# Patient Record
Sex: Male | Born: 1985 | Race: White | Hispanic: No | Marital: Single | Smoking: Current every day smoker
Health system: Southern US, Community
[De-identification: ages and names within clinical notes are randomized; demographics above are authoritative.]

## PROBLEM LIST (undated history)

## (undated) DIAGNOSIS — J069 Acute upper respiratory infection, unspecified: Secondary | ICD-10-CM

## (undated) HISTORY — DX: Acute upper respiratory infection, unspecified: J06.9

---
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
## 2011-01-07 ENCOUNTER — Ambulatory Visit (HOSPITAL_BASED_OUTPATIENT_CLINIC_OR_DEPARTMENT_OTHER)
Admission: RE | Admit: 2011-01-07 | Discharge: 2011-01-07 | Disposition: A | Discharge status: Home / Self Care | Payer: BLUE CROSS/BLUE SHIELD | Source: Ambulatory Visit | Attending: Urology | Admitting: Urology

## 2011-01-07 ENCOUNTER — Ambulatory Visit (HOSPITAL_COMMUNITY): Payer: BLUE CROSS/BLUE SHIELD

## 2011-01-07 DIAGNOSIS — N201 Calculus of ureter: Secondary | ICD-10-CM | POA: Insufficient documentation

## 2011-01-07 DIAGNOSIS — N209 Urinary calculus, unspecified: Secondary | ICD-10-CM | POA: Insufficient documentation

## 2011-01-07 DIAGNOSIS — Z01812 Encounter for preprocedural laboratory examination: Secondary | ICD-10-CM | POA: Insufficient documentation

## 2011-01-07 DIAGNOSIS — Z01818 Encounter for other preprocedural examination: Secondary | ICD-10-CM | POA: Insufficient documentation

## 2011-01-07 DIAGNOSIS — F17200 Nicotine dependence, unspecified, uncomplicated: Secondary | ICD-10-CM | POA: Insufficient documentation

## 2011-01-07 LAB — POCT HEMOGLOBIN-HEMACUE: Hemoglobin: 15.2 g/dL (ref 13.0–17.0)

## 2011-01-10 NOTE — Op Note (Signed)
NAME:  Christian Curtis, Christian Curtis                 ACCOUNT NO.:  [id number]  MEDICAL RECORD NO.:  [id number]            PATIENT TYPE:  LOCATION:                                 FACILITY:  PHYSICIAN:  Vicci Reder C. Vernie Ammons, M.D.       DATE OF BIRTH:  DATE OF PROCEDURE: DATE OF DISCHARGE:                              OPERATIVE REPORT   PREOPERATIVE DIAGNOSIS:  Right ureteral calculus.  POSTOPERATIVE DIAGNOSIS:  Right ureteral calculus.  PROCEDURES: 1. Cystoscopy with right retrograde pyelogram including     interpretation. 2. Right ureteroscopy. 3. Stone extraction. 4. Double-J stent (5-French, 24-cm Polaris with string).  SURGEON:  Mariza Bourget C. Vernie Ammons, M.D.  ANESTHESIA:  General.  SPECIMENS:  Stone given to the patient.  BLOOD LOSS:  Minimal.  COMPLICATIONS:  None.  INDICATIONS:  The patient is a 25 year old male who began to have difficulty with a right flank pain due to a stone that was found to be 4 mm to 5 mm and located at the ureteropelvic junction 6 months ago.  He is continued to have intermittent pain and was found by CT scan to have the stone still present within the ureter.  We, therefore, discussed treatment options and has elected to proceed with ureteroscopic extraction of the stone.  The risks, complications, alternatives and limitations were discussed as well.  DESCRIPTION OF OPERATION:  After informed consent, the patient was brought to the major OR, placed on the table, administered general anesthesia, then moved to the dorsal lithotomy position.  His genitalia was then sterilely prepped and draped and an official time-out was then performed.  Initially, the 22-French cystoscope with 12-degree lens was passed under direct vision down urethra which was noted be normal.  The prostatic urethra was nonobstructing and noted be free of lesions.  The bladder was then entered and noted to be free of any tumor, stones or inflammatory lesions.  Ureteral orifices were normal in  configuration and position.  However, there was bloody efflux coming from the right ureter.  6-French open-ended ureteral catheter was then passed through the cystoscope and into the right ureteral orifice and full strength contrast was then injected to perform a right retrograde pyelogram in standard fashion.  Under direct fluoroscopy, as I injected the contrast, I noted a normal-appearing distal ureter but at about the bottom of the sacrum, the ureter transitioned into mildly dilated ureter with a filling defect consistent with the stone seen within.  The remainder of the collecting system was noted be entirely normal.  A 0.038-inch floppy tip guidewire was then passed into the area of the renal pelvis under fluoroscopy and I then attempted to pass the inner portion of the ureteral access sheath over the guidewire but met some resistance.  I therefore elected to use the dilating balloon and passed this over the guidewire beyond the area where the transition from dilated ureter to normal-appearing ureter was located.  I then inflated the balloon and did note some wasting of the balloon.  It was left inflated for 3 minutes and then deflated.  The guidewire was left in place and the  flexible ureteroscope was then passed over the guidewire and once the scope was within the ureter, I removed the guidewire and then passed the scope under direct vision.  I was able to identify the stone and I noted there are actually 2 stones present.  I used a nitinol basket to grasp the most distal stone and extracted that without difficulty.  I then reinserted the cystoscope, reinserted the guidewire and then was able to pass the ureteral access sheath over the guidewire. I then passed the flexible ureteroscope through the access sheath and could not identify any further stone, although there were some stone fragments.  I therefore made sure that the stone had not migrated up into the kidney by advancing  the scope into the kidney and systematically inspected each calix including the upper middle and pole calyceal systems.  This was performed twice to be sure there was no stone present.  I also inspected the renal pelvis and noted no stone and therefore backed the scope on down the ureter under direct vision and noted multiple small stone fragments, indicating that the larger fragment appeared to have broken into multiple small pieces.  I, therefore, passed the guidewire through the ureteroscope into the area of the renal pelvis and left that in place, as I backed the ureteroscope down and out of the ureter under direct vision again noting no further stone present.  The ureter was noted to be intact throughout its length.  The cystoscope was back loaded over the guidewire and the stent passed over the guidewire into the area of the renal pelvis with good curl being noted in the renal pelvis as I removed the guidewire.  The bladder was drained and the string that was affixed to the distal aspect of the stent was left in place and will be affixed to the dorsum of the penis. I used 2% lidocaine jelly and inserted this in urethra and applied a penile clamp.  The patient was awakened and taken to recovery room in stable and satisfactory condition.  He tolerated procedure well.  There were no intraoperative complications.  He was given written instructions and a prescription for Pyridium 200 mg #6 and Detrol LA 4 mg for every 24-hour use #16 with followup in my office in 1 week for stent removal.     Imelda Dandridge C. Vernie Ammons, M.D.     MCO/MEDQ  D:  01/07/2011  T:  01/07/2011  Job:  [id number]  Electronically Signed by Ihor Gully M.D. on 01/10/2011 07:40:29 PM

## 2012-02-28 IMAGING — CR DG ABDOMEN 1V
2 series · 2 of 2 positions shown · non-contrast
Comparison: None.

CLINICAL DATA: Pre lithotripsy for right urinary tract calculi

ABDOMEN - 1 VIEW

[t abdomen supine (1 of 2)]
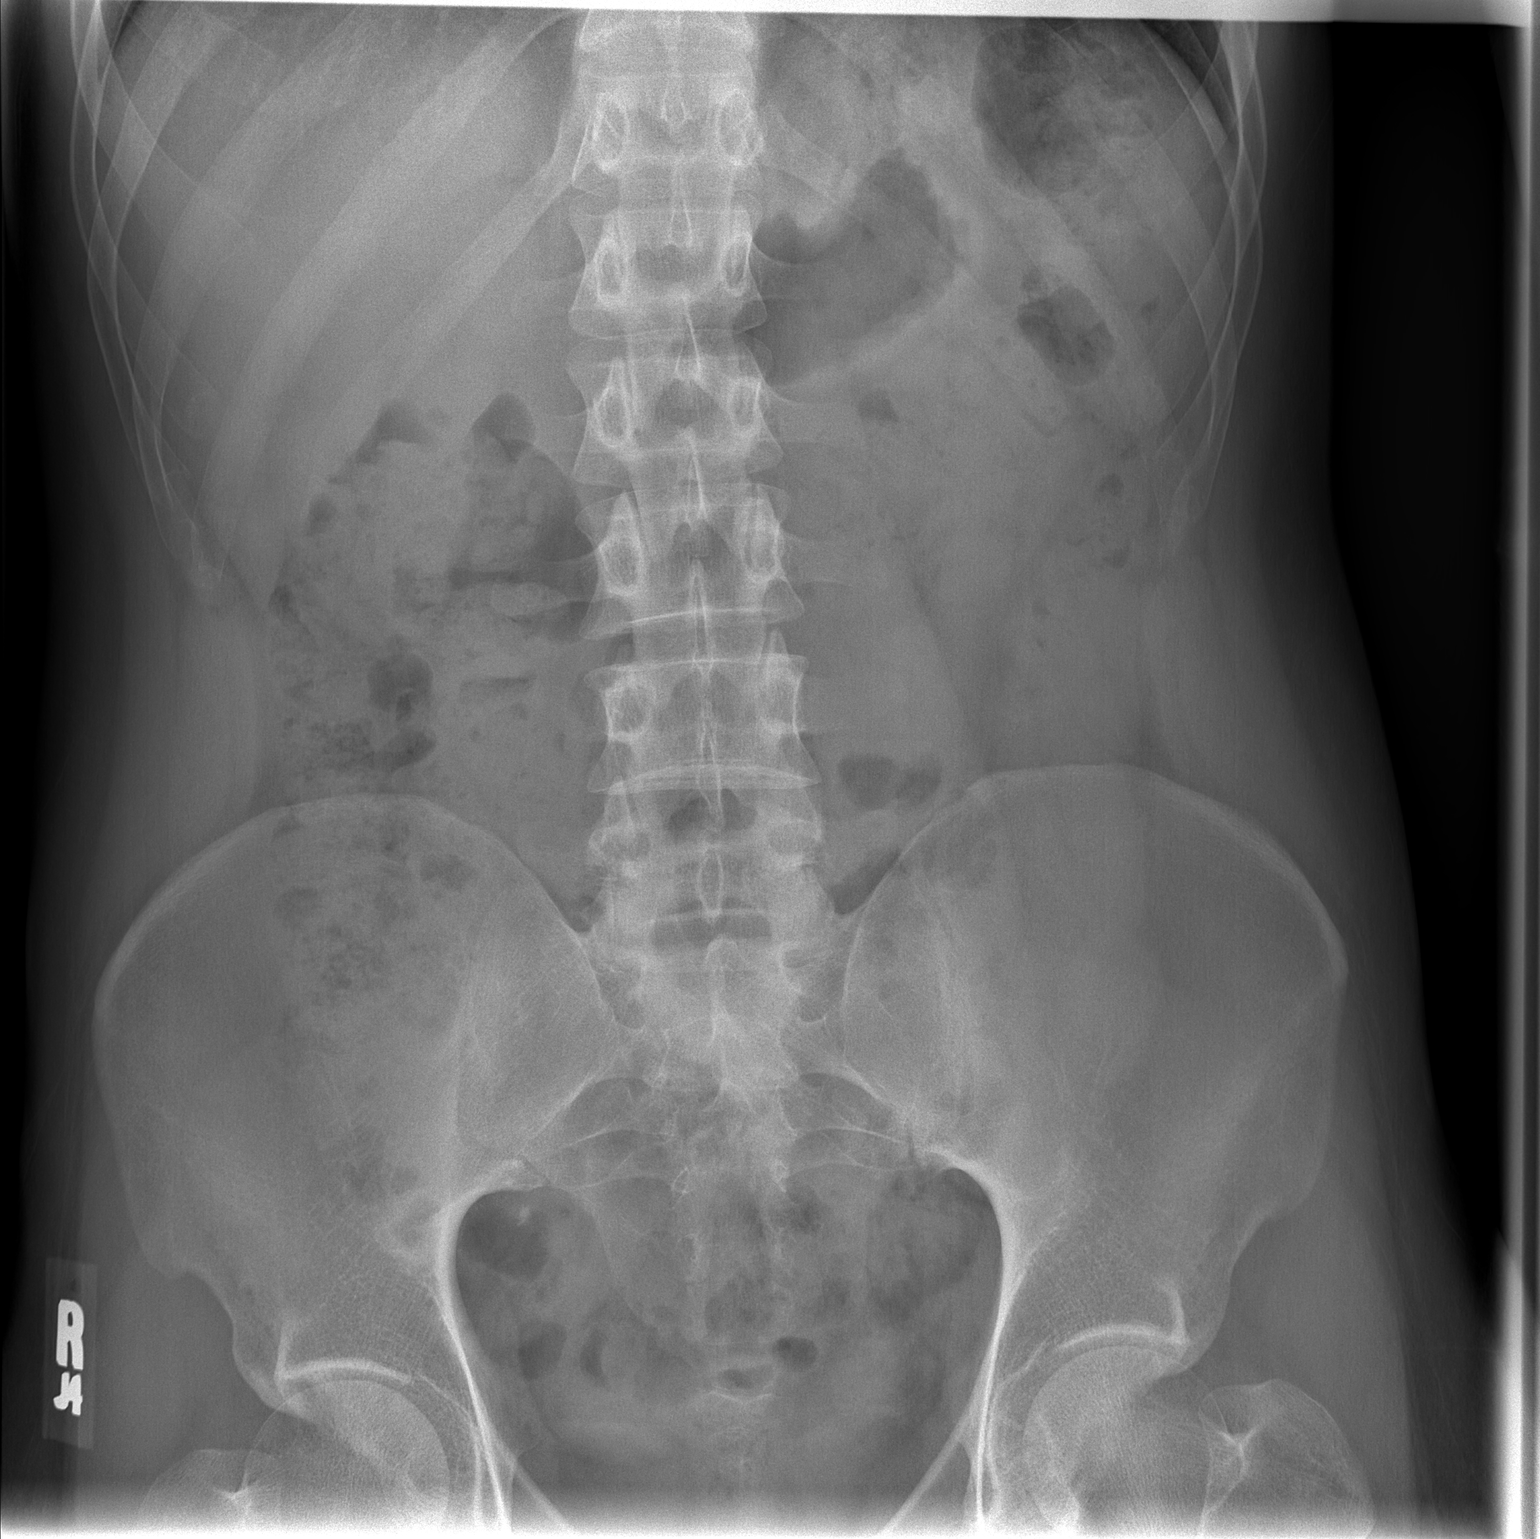

[t abdomen supine (2 of 2)]
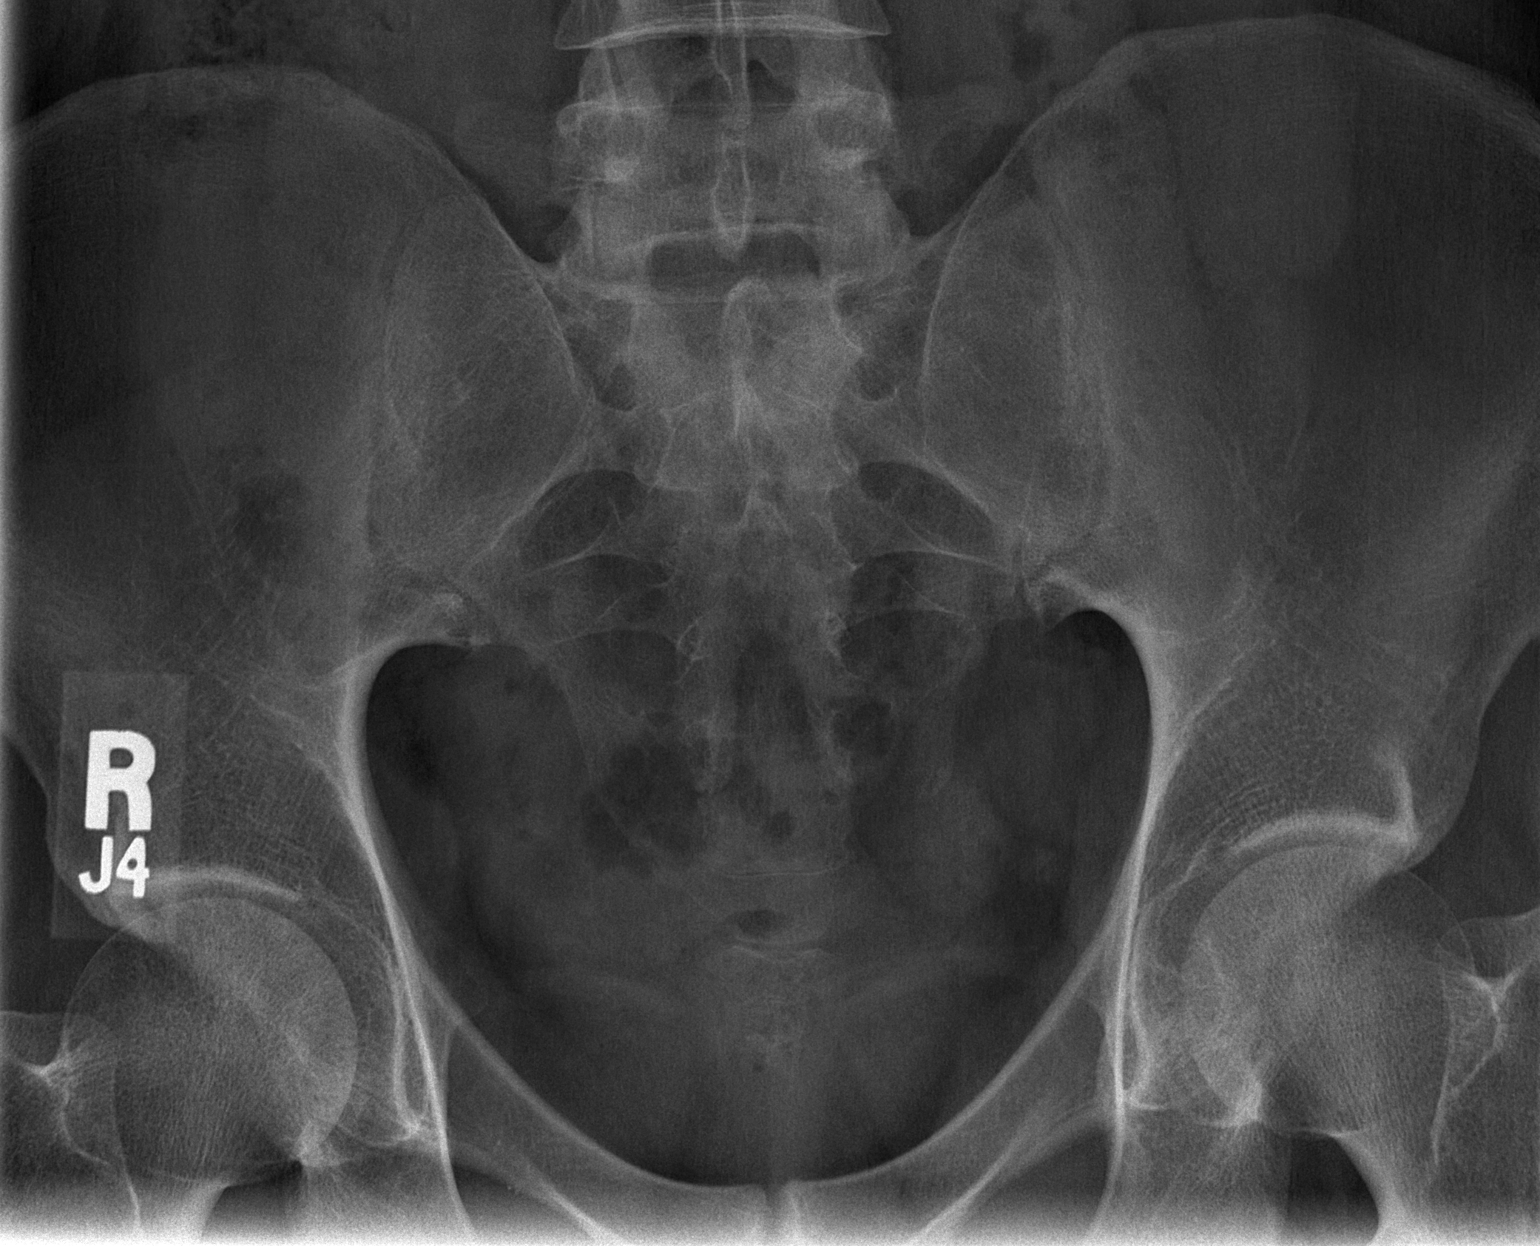

[2 of 2 positions shown; findings below may reference images not displayed]

FINDINGS: There is a 5.5 x 2.9 mm calcification in the right
pelvis, just below the inferior aspect of the SI joint.  This is a
probable distal ureteral calculus.

No obvious renal calculi.
IMPRESSION: Probable 5.5 x 2.9 mm right distal ureteral calculus.

## 2021-11-24 DIAGNOSIS — S93509A Unspecified sprain of unspecified toe(s), initial encounter: Secondary | ICD-10-CM | POA: Diagnosis not present

## 2021-11-24 DIAGNOSIS — S20211A Contusion of right front wall of thorax, initial encounter: Secondary | ICD-10-CM | POA: Diagnosis not present

## 2021-11-24 DIAGNOSIS — S93502A Unspecified sprain of left great toe, initial encounter: Secondary | ICD-10-CM | POA: Diagnosis not present

## 2022-03-25 DIAGNOSIS — J329 Chronic sinusitis, unspecified: Secondary | ICD-10-CM | POA: Diagnosis not present

## 2022-03-25 DIAGNOSIS — M542 Cervicalgia: Secondary | ICD-10-CM | POA: Diagnosis not present

## 2022-03-25 DIAGNOSIS — R69 Illness, unspecified: Secondary | ICD-10-CM | POA: Diagnosis not present

## 2022-03-25 DIAGNOSIS — F17200 Nicotine dependence, unspecified, uncomplicated: Secondary | ICD-10-CM | POA: Diagnosis not present

## 2022-03-25 DIAGNOSIS — B354 Tinea corporis: Secondary | ICD-10-CM | POA: Diagnosis not present

## 2022-06-26 DIAGNOSIS — J342 Deviated nasal septum: Secondary | ICD-10-CM | POA: Diagnosis not present

## 2022-06-26 DIAGNOSIS — J343 Hypertrophy of nasal turbinates: Secondary | ICD-10-CM | POA: Diagnosis not present

## 2022-09-30 DIAGNOSIS — Z0000 Encounter for general adult medical examination without abnormal findings: Secondary | ICD-10-CM | POA: Diagnosis not present

## 2022-09-30 DIAGNOSIS — Z13220 Encounter for screening for lipoid disorders: Secondary | ICD-10-CM | POA: Diagnosis not present

## 2022-10-22 ENCOUNTER — Ambulatory Visit: Payer: 59 | Admitting: Internal Medicine

## 2022-10-22 ENCOUNTER — Encounter: Payer: Self-pay | Admitting: Internal Medicine

## 2022-10-22 ENCOUNTER — Other Ambulatory Visit: Payer: Self-pay

## 2022-10-22 VITALS — BP 104/70 | HR 85 | Temp 98.3°F | Resp 16 | Ht 71.0 in | Wt 256.1 lb

## 2022-10-22 DIAGNOSIS — J3089 Other allergic rhinitis: Secondary | ICD-10-CM | POA: Diagnosis not present

## 2022-10-22 DIAGNOSIS — J302 Other seasonal allergic rhinitis: Secondary | ICD-10-CM

## 2022-10-22 DIAGNOSIS — J310 Chronic rhinitis: Secondary | ICD-10-CM

## 2022-10-22 DIAGNOSIS — F17210 Nicotine dependence, cigarettes, uncomplicated: Secondary | ICD-10-CM | POA: Diagnosis not present

## 2022-10-22 MED ORDER — AZELASTINE HCL 0.1 % NA SOLN: 1.0000 | Freq: Two times a day (BID) | NASAL | 5 refills | Status: DC | PRN

## 2022-10-22 MED ORDER — FLUTICASONE PROPIONATE 50 MCG/ACT NA SUSP: 2.0000 | Freq: Every day | NASAL | 5 refills | Status: DC

## 2022-10-22 NOTE — Progress Notes (Signed)
NEW PATIENT  Date of Service/Encounter:  10/22/22  Consult requested by: No primary care provider on file.   Subjective:   Christian Curtis (DOB: 1986/04/11) is a 37 y.o. male who presents to the clinic on 10/22/2022 with a chief complaint of Allergy Testing .    History obtained from: chart review and patient.  Rhinitis:  Started 2-3 years ago.  Symptoms include:  headaches, nasal congestion, rhinorrhea, post nasal drainage, and itchy eyes  Occurs year-round Potential triggers: cats Treatments tried:  Flonase PRN does help  He does not use oral anti histamines because he gets very bad dry eyes from it.   Previous allergy testing: no History of reflux/heartburn: no History of sinus surgery: no Nonallergic triggers: none  He was seen by ENT 06/26/2022 for chronic sinusitis and noted to have nasal septal deviation, turbinate hypertrophy and dizziness.  Also has a septal spur on L.  Discussed Flonase and OTC anti histamine.   Tobacco Use: He is a current smoker and has smoked since 2006 about 0.25 packs per day. Reports using it now due to stress and boredom. Denies cravings.  He has not used anything for this.  He has thought about quitting but isn't completely sure about it yet.  Has never tried OTC nicotine replacements.    Past Medical History: Past Medical History:  Diagnosis Date   Recurrent upper respiratory infection (URI)    Past Surgical History: History reviewed. No pertinent surgical history.  Family History: Family History  Problem Relation Age of Onset   Allergic rhinitis Mother     Social History:  Lives in a 5 year apartment [place] in bedroom: carpet Pets: cat Tobacco use/exposure: 2006; 0.25 pack per day Job: unemployed  Medication List:  Allergies as of 10/22/2022   Not on File      Medication List        Accurate as of October 22, 2022  3:22 PM. If you have any questions, ask your nurse or doctor.           amphetamine-dextroamphetamine 10 MG tablet Commonly known as: ADDERALL Take 10 mg by mouth daily.   AzaSite 1 % ophthalmic solution Generic drug: azithromycin Place 1 drop into both eyes daily.   azelastine 0.1 % nasal spray Commonly known as: ASTELIN Place 1 spray into both nostrils 2 (two) times daily as needed for rhinitis or allergies. Use in each nostril as directed Started by: Larose Kells, MD   clotrimazole-betamethasone cream Commonly known as: LOTRISONE Apply 1 Application topically as needed.   cycloSPORINE 0.05 % ophthalmic emulsion Commonly known as: RESTASIS Place 1 drop into both eyes daily.   fluticasone 50 MCG/ACT nasal spray Commonly known as: FLONASE Place 2 sprays into both nostrils daily. Started by: Larose Kells, MD         REVIEW OF SYSTEMS: Pertinent positives and negatives discussed in HPI.   Objective:   Physical Exam: BP 104/70   Pulse 85   Temp 98.3 F (36.8 C) (Temporal)   Resp 16   Ht 5\' 11"  (1.803 m)   Wt 256 lb 1.6 oz (116.2 kg)   SpO2 97%   BMI 35.72 kg/m  Body mass index is 35.72 kg/m. GEN: alert, well developed HEENT: clear conjunctiva, TM grey and translucent, nose with + inferior turbinate hypertrophy, pink nasal mucosa, slight clear rhinorrhea, no cobblestoning HEART: regular rate and rhythm, no murmur LUNGS: clear to auscultation bilaterally, no coughing, unlabored respiration ABDOMEN: soft, non distended  SKIN: no rashes or lesions  Reviewed:  He was seen by ENT 06/26/2022 for chronic sinusitis and noted to have nasal septal deviation, turbinate hypertrophy and dizziness.  Also has a septal spur on L.  Discussed Flonase and OTC anti histamine.   09/30/2022: saw PCP Ferne Reus PA due to concern for cat allergies and was referred to Allergy.  He is also a current smoker and they did discuss tobacco cessation.     Skin Testing:  Skin prick testing was placed, which includes aeroallergens/foods, histamine  control, and saline control.  Verbal consent was obtained prior to placing test.  Patient tolerated procedure well.  Allergy testing results were read and interpreted by myself, documented by clinical staff. Adequate positive and negative control.  Results discussed with patient/family.  Airborne Adult Perc - 10/22/22 1415     Allergen Manufacturer Lavella Hammock    Location Back    Number of Test 59             Intradermal - 10/22/22 1450     Time Antigen Placed 1450    Allergen Manufacturer Lavella Hammock    Location Back    Number of Test 15               Assessment:   1. Chronic rhinitis   2. Cigarette nicotine dependence without complication     Plan/Recommendations:  Allergic Rhinitis - Positive skin test 09/2022: grasses, mold, cat, dog. - Avoidance measures discussed. - Use nasal saline rinses before nose sprays such as with Neilmed Sinus Rinse.  Use distilled water.   - Use Flonase 2 sprays each nostril daily. Aim upward and outward. - Use Azelastine 1-2 sprays each nostril twice daily as needed. Aim upward and outward. - Okay to hold off on oral anti histamines as they cause dry eyes. He also has Blepharitis.   - Consider allergy shots as long term control of your symptoms by teaching your immune system to be more tolerant of your allergy triggers - Given paper prescriptions for INCS and INAH as they would not e-prescribe.    Tobacco Use - I want you to strongly consider stopping smoking. This is one of the best things you can do for your long term health. Discussed trying over the counter nicotine replacement such as patch. Patient provided counseling on tobacco cessation today. Discussed the various impacts smoking has on health such as increased infections, lung disease, malignancy, heart disease, stroke etc. Patient is alert and competent.   Willingness to quit: interested but not completely sure yet Methods/skills for cessation: wants to do it on his own and  slowly Medication management for smoking: discussed OTC nicotine patch Resources provided include OTC nicotine replacements Quit date: none Will discuss further at next follow up visit. Spent over 3 minutes for tobacco cessation counseling.       Return in about 2 months (around 12/21/2022).  Harlon Flor, MD [facility] [place] [facility] [place]

## 2022-10-22 NOTE — Patient Instructions (Addendum)
Allergic Rhinitis - Positive skin test 09/2022: grasses, mold, cat, dog. - Avoidance measures discussed. - Use nasal saline rinses before nose sprays such as with Neilmed Sinus Rinse.  Use distilled water.   - Use Flonase 2 sprays each nostril daily. Aim upward and outward. - Use Azelastine 1-2 sprays each nostril twice daily as needed. Aim upward and outward. - Okay to hold off on oral anti histamines as they cause dry eyes. He also has Blepharitis.   - Consider allergy shots as long term control of your symptoms by teaching your immune system to be more tolerant of your allergy triggers  Tobacco Use - I want you to strongly consider stopping smoking. This is one of the best things you can do for your long term health. Discussed trying over the counter nicotine replacement such as patch.

## 2022-12-24 ENCOUNTER — Other Ambulatory Visit: Payer: Self-pay

## 2022-12-24 ENCOUNTER — Encounter: Payer: Self-pay | Admitting: Internal Medicine

## 2022-12-24 ENCOUNTER — Ambulatory Visit: Payer: 59 | Admitting: Internal Medicine

## 2022-12-24 VITALS — BP 120/84 | HR 86 | Temp 98.0°F | Resp 16 | Wt 259.7 lb

## 2022-12-24 DIAGNOSIS — J3089 Other allergic rhinitis: Secondary | ICD-10-CM

## 2022-12-24 DIAGNOSIS — F17210 Nicotine dependence, cigarettes, uncomplicated: Secondary | ICD-10-CM

## 2022-12-24 DIAGNOSIS — J302 Other seasonal allergic rhinitis: Secondary | ICD-10-CM

## 2022-12-24 MED ORDER — AZELASTINE HCL 0.1 % NA SOLN: 1.0000 | Freq: Two times a day (BID) | NASAL | 5 refills | Status: DC | PRN

## 2022-12-24 MED ORDER — FLUTICASONE PROPIONATE 50 MCG/ACT NA SUSP: 2.0000 | Freq: Every day | NASAL | 5 refills | Status: DC

## 2022-12-24 NOTE — Progress Notes (Signed)
FOLLOW UP Date of Service/Encounter:  12/24/22   Subjective:  Christian Curtis (DOB: May 31, 1986) is a 37 y.o. male who returns to the [facility] on 12/24/2022 for follow up for allergic rhinoconjunctivitis and tobacco use.  History obtained from: chart review and patient. Last visit was on October 22, 2022 at that time skin prick test was positive for grasses, mold, cat, dog.  He was started on Flonase and azelastine.  We did not do oral antihistamines as he has trouble with dry eyes/blepharitis.  We had also discussed smoking cessation at the time.  Rhinoconjunctivitis: Reports doing better overall since last visit.  He does still have some congestion and rhinorrhea especially when he forgets to take his medications and this does tend to happen more frequently due to his ADHD.  He also feels like the medications do not last the whole day.  He is using Flonase daily and azelastine daily.  He is interested in allergy shots but would like to discuss with his insurance company first regarding the cost.  Tobacco use: Still smoking anywhere from 3 to 7 cigarettes a day.  He does want to stop smoking at some point but is not sure about completely quitting currently.  Past Medical History: Past Medical History:  Diagnosis Date   Recurrent upper respiratory infection (URI)     Objective:  BP 120/84   Pulse 86   Temp 98 F (36.7 C) (Temporal)   Resp 16   Wt 259 lb 11.2 oz (117.8 kg)   SpO2 96%   BMI 36.22 kg/m  Body mass index is 36.22 kg/m. Physical Exam: GEN: alert, well developed HEENT: clear conjunctiva, TM grey and translucent, nose with moderate inferior turbinate hypertrophy, pink nasal mucosa, clear rhinorrhea, no cobblestoning HEART: regular rate and rhythm, no murmur LUNGS: clear to auscultation bilaterally, no coughing, unlabored respiration SKIN: no rashes or lesions  Assessment:   1. Seasonal and perennial allergic rhinitis   2. Cigarette nicotine  dependence without complication     Plan/Recommendations:  Allergic Rhinitis - Uncontrolled, discussed daily compliance with nose sprays for maximal effect and to also consider AIT.  - Positive skin test 09/2022: grasses, mold, cat, dog. - Avoidance measures discussed. - Use nasal saline rinses before nose sprays such as with Neilmed Sinus Rinse.  Use distilled water.   - Use Flonase 2 sprays each nostril daily. Aim upward and outward. - Use Azelastine 1-2 sprays each nostril twice daily as needed. Aim upward and outward. - Okay to hold off on oral anti histamines as they cause dry eyes. He also has Blepharitis.   - Consider allergy shots as long term control of your symptoms by teaching your immune system to be more tolerant of your allergy triggers.  Please call your insurance company to see the cost associated with allergy shots and if interested in starting, let us know.   Tobacco Use - I want you to strongly consider stopping smoking. This is one of the best things you can do for your long term health and it is also likely contributing to your uncontrolled upper airway symptoms.  Patient provided counseling on tobacco cessation today. Discussed the various impacts smoking has on health such as increased infections, lung disease, malignancy, heart disease, stroke etc. Patient is alert and competent.   Willingness to quit: interested but not completely sure yet Methods/skills for cessation: wants to do it on his own and slowly Medication management for smoking: none, discussed focusing on wanting to  quit first Resources provided include none Quit date: none Will discuss further at next follow up visit. Spent over 3 minutes for tobacco cessation counseling.       Return in about 4 months (around 04/25/2023).  Harlon Flor, MD [facility] [place] [facility] [place]

## 2022-12-24 NOTE — Patient Instructions (Signed)
Allergic Rhinitis - Positive skin test 09/2022: grasses, mold, cat, dog. - Avoidance measures discussed. - Use nasal saline rinses before nose sprays such as with Neilmed Sinus Rinse.  Use distilled water.   - Use Flonase 2 sprays each nostril daily. Aim upward and outward. - Use Azelastine 1-2 sprays each nostril twice daily as needed. Aim upward and outward. - Okay to hold off on oral anti histamines as they cause dry eyes. He also has Blepharitis.   - Consider allergy shots as long term control of your symptoms by teaching your immune system to be more tolerant of your allergy triggers.  Please call your insurance company to see the cost associated with allergy shots and if interested in starting, let us know.   Tobacco Use - I want you to strongly consider stopping smoking. This is one of the best things you can do for your long term health. Discussed trying over the counter nicotine replacement such as patch.

## 2023-03-31 DIAGNOSIS — F909 Attention-deficit hyperactivity disorder, unspecified type: Secondary | ICD-10-CM | POA: Diagnosis not present

## 2023-04-25 ENCOUNTER — Encounter: Payer: Self-pay | Admitting: Internal Medicine

## 2023-04-25 ENCOUNTER — Ambulatory Visit (INDEPENDENT_AMBULATORY_CARE_PROVIDER_SITE_OTHER): Payer: 59 | Admitting: Internal Medicine

## 2023-04-25 VITALS — BP 106/72 | HR 66 | Temp 98.7°F

## 2023-04-25 DIAGNOSIS — J3089 Other allergic rhinitis: Secondary | ICD-10-CM

## 2023-04-25 DIAGNOSIS — J302 Other seasonal allergic rhinitis: Secondary | ICD-10-CM | POA: Diagnosis not present

## 2023-04-25 MED ORDER — AZELASTINE HCL 0.1 % NA SOLN: 1.0000 | Freq: Two times a day (BID) | NASAL | 5 refills | Status: AC | PRN

## 2023-04-25 MED ORDER — FLUTICASONE PROPIONATE 50 MCG/ACT NA SUSP: 2.0000 | Freq: Every day | NASAL | 5 refills | Status: AC

## 2023-04-25 NOTE — Progress Notes (Signed)
   FOLLOW UP Date of Service/Encounter:  04/25/23   Subjective:  Christian Curtis (DOB: 08/22/1986) is a 37 y.o. male who [facility] for follow up for allergic rhinitis and tobacco use.   History obtained from: chart review and patient. Last visit was with me on 12/24/2022 and discussed AIT for uncontrolled symptoms.  Also discussed smoking cessation.   Doing about the same since last visit.  Still having a lot of stuffy nose and at at the end of work shift, has runny nose. He did call insurance and confirm benefits for AIT.  He is working at [employer] and is starting a new shift so wants to figure out his hours before he starts shots.  Taking Flonase and Azelastine daily and it helps but symptoms reoccur about 3-4 hours after.  Unable to do oral anti histamines due to dry eyes.   Past Medical History: Past Medical History:  Diagnosis Date   Recurrent upper respiratory infection (URI)     Objective:  BP 106/72   Pulse 66   Temp 98.7 F (37.1 C) (Temporal)   SpO2 96%  There is no height or weight on file to calculate BMI. Physical Exam: GEN: alert, well developed HEENT: clear conjunctiva, TM grey and translucent, nose with mild inferior turbinate hypertrophy, boggy nasal mucosa, clear rhinorrhea, no cobblestoning HEART: regular rate and rhythm, no murmur LUNGS: clear to auscultation bilaterally, no coughing, unlabored respiration SKIN: no rashes or lesions  Assessment:   1. Seasonal and perennial allergic rhinitis     Plan/Recommendations:  Allergic Rhinitis - Uncontrolled, discussed AIT.   - Positive skin test 09/2022: grasses, mold, cat, dog. - Avoidance measures discussed. - Use nasal saline rinses before nose sprays such as with Neilmed Sinus Rinse.  Use distilled water.   - Use Flonase 2 sprays each nostril daily. Aim upward and outward. - Use Azelastine 1-2 sprays each nostril twice daily as needed. Aim upward and outward. - Okay to  hold off on daily oral anti histamines as they cause dry eyes. He also has Blepharitis.  Take Zyrtec 10mg  on shot days though.  - Consider allergy shots as long term control of your symptoms by teaching your immune system to be more tolerant of your allergy triggers.  Please call us back if interested in starting to let us know and we will mix the vials and send in the Epipen.      Return in about 4 months (around 08/25/2023).  Alesia Morin, MD [facility]

## 2023-04-25 NOTE — Patient Instructions (Addendum)
Allergic Rhinitis - Positive skin test 09/2022: grasses, mold, cat, dog. - Avoidance measures discussed. - Use nasal saline rinses before nose sprays such as with Neilmed Sinus Rinse.  Use distilled water.   - Use Flonase 2 sprays each nostril daily. Aim upward and outward. - Use Azelastine 1-2 sprays each nostril twice daily as needed. Aim upward and outward. - Okay to hold off on oral anti histamines as they cause dry eyes. He also has Blepharitis.  Take Zyrtec 10mg  on shot days.  - Consider allergy shots as long term control of your symptoms by teaching your immune system to be more tolerant of your allergy triggers.  Please call us back if interested in starting to let us know and we will mix the vials and send in the Epipen.

## 2023-05-16 ENCOUNTER — Other Ambulatory Visit: Payer: Self-pay | Admitting: *Deleted

## 2023-05-16 MED ORDER — EPINEPHRINE 0.3 MG/0.3ML IJ SOAJ: 0.3000 mg | INTRAMUSCULAR | 1 refills | Status: AC | PRN

## 2023-05-19 ENCOUNTER — Other Ambulatory Visit: Payer: Self-pay | Admitting: Internal Medicine

## 2023-05-19 DIAGNOSIS — J3089 Other allergic rhinitis: Secondary | ICD-10-CM

## 2023-05-19 DIAGNOSIS — J3081 Allergic rhinitis due to animal (cat) (dog) hair and dander: Secondary | ICD-10-CM

## 2023-05-19 DIAGNOSIS — J301 Allergic rhinitis due to pollen: Secondary | ICD-10-CM

## 2023-05-19 DIAGNOSIS — J302 Other seasonal allergic rhinitis: Secondary | ICD-10-CM

## 2023-05-19 NOTE — Progress Notes (Signed)
 AIT orders placed. Epipen sent.

## 2023-05-20 NOTE — Progress Notes (Signed)
Aeroallergen Immunotherapy   Ordering Provider: Alesia Morin   Patient Details  Name: Christian Curtis  MRN: [id number]  Date of Birth: 09/12/1986   Order 1 of 2   Vial Label: grasses, dogs, cats   0.3 ml (Volume)  BAU Concentration -- 7 Grass Mix* 100,000 ([address] [place], [place], [place], Perennial [place], [place], Sweet [place], [place])  0.5 ml (Volume)  1:10 Concentration -- Cat Hair  0.5 ml (Volume)  1:10 Concentration -- Dog Epithelia    1.3  ml Extract Subtotal  3.7  ml Diluent  5.0  ml Maintenance Total   Schedule:  B  Silver Vial (1:1,000,000): Schedule B (6 doses)  Blue Vial (1:100,000): Schedule B (6 doses)  Yellow Vial (1:10,000): Schedule B (6 doses)  Green Vial (1:1,000): Schedule B (6 doses)  Red Vial (1:100): Schedule A (10 doses)   Special Instructions: Bring Epipen

## 2023-05-20 NOTE — Progress Notes (Signed)
Aeroallergen Immunotherapy   Ordering Provider: Alesia Morin   Patient Details  Name: Christian Curtis  MRN: [id number]  Date of Birth: 1986-01-13   Order 2 of 2   Vial Label: mold   0.2 ml (Volume)  1:20 Concentration -- Bipolaris sorokiniana  0.2 ml (Volume)  1:20 Concentration -- Drechslera spicifera  0.2 ml (Volume)  1:10 Concentration -- Mucor plumbeus  0.2 ml (Volume)  1:10 Concentration -- Fusarium moniliforme  0.2 ml (Volume)  1:40 Concentration -- Aureobasidium pullulans  0.2 ml (Volume)  1:10 Concentration -- Rhizopus oryzae    1.2  ml Extract Subtotal  3.8  ml Diluent  5.0  ml Maintenance Total   Schedule:  B  Silver Vial (1:1,000,000): Schedule B (6 doses)  Blue Vial (1:100,000): Schedule B (6 doses)  Yellow Vial (1:10,000): Schedule B (6 doses)  Green Vial (1:1,000): Schedule B (6 doses)  Red Vial (1:100): Schedule A (10 doses)   Special Instructions: Bring Epipen

## 2023-05-20 NOTE — Progress Notes (Signed)
VIALS EXP 05-19-24

## 2023-05-21 DIAGNOSIS — J3081 Allergic rhinitis due to animal (cat) (dog) hair and dander: Secondary | ICD-10-CM | POA: Diagnosis not present

## 2023-05-22 DIAGNOSIS — J302 Other seasonal allergic rhinitis: Secondary | ICD-10-CM | POA: Diagnosis not present

## 2023-06-10 ENCOUNTER — Ambulatory Visit (INDEPENDENT_AMBULATORY_CARE_PROVIDER_SITE_OTHER): Payer: 59 | Admitting: *Deleted

## 2023-06-10 DIAGNOSIS — J309 Allergic rhinitis, unspecified: Secondary | ICD-10-CM | POA: Diagnosis not present

## 2023-06-10 NOTE — Progress Notes (Signed)
Immunotherapy   Patient Details  Name: Christian Curtis MRN: [id number] Date of Birth: 17-Feb-1986  06/10/2023  Christian Curtis started injections for  MOLD, GRASS, DOG, CAT Following schedule: B  Frequency:1 time per week Epi-Pen:Epi-Pen Available  Consent signed and patient instructions given. Patient started allergy injections today and received 0.6mL of MOLD in the LUA and 0.30mL of GRASS-DOG-CAT in the RUA. Patient waited 30 minutes in office and did not experience any issues.    Christian Curtis 06/10/2023, 4:59 PM

## 2023-06-17 ENCOUNTER — Ambulatory Visit (INDEPENDENT_AMBULATORY_CARE_PROVIDER_SITE_OTHER): Payer: 59

## 2023-06-17 DIAGNOSIS — J309 Allergic rhinitis, unspecified: Secondary | ICD-10-CM | POA: Diagnosis not present

## 2023-06-24 ENCOUNTER — Ambulatory Visit (INDEPENDENT_AMBULATORY_CARE_PROVIDER_SITE_OTHER): Payer: Self-pay | Admitting: *Deleted

## 2023-06-24 DIAGNOSIS — J309 Allergic rhinitis, unspecified: Secondary | ICD-10-CM | POA: Diagnosis not present

## 2023-07-01 ENCOUNTER — Ambulatory Visit (INDEPENDENT_AMBULATORY_CARE_PROVIDER_SITE_OTHER): Payer: 59

## 2023-07-01 DIAGNOSIS — J309 Allergic rhinitis, unspecified: Secondary | ICD-10-CM

## 2023-07-08 ENCOUNTER — Ambulatory Visit (INDEPENDENT_AMBULATORY_CARE_PROVIDER_SITE_OTHER): Payer: Self-pay | Admitting: *Deleted

## 2023-07-08 DIAGNOSIS — J309 Allergic rhinitis, unspecified: Secondary | ICD-10-CM | POA: Diagnosis not present

## 2023-07-15 ENCOUNTER — Ambulatory Visit (INDEPENDENT_AMBULATORY_CARE_PROVIDER_SITE_OTHER): Payer: 59 | Admitting: *Deleted

## 2023-07-15 DIAGNOSIS — J309 Allergic rhinitis, unspecified: Secondary | ICD-10-CM | POA: Diagnosis not present

## 2023-07-22 ENCOUNTER — Ambulatory Visit (INDEPENDENT_AMBULATORY_CARE_PROVIDER_SITE_OTHER): Payer: Self-pay | Admitting: *Deleted

## 2023-07-22 DIAGNOSIS — J309 Allergic rhinitis, unspecified: Secondary | ICD-10-CM | POA: Diagnosis not present

## 2023-07-29 ENCOUNTER — Ambulatory Visit (INDEPENDENT_AMBULATORY_CARE_PROVIDER_SITE_OTHER): Payer: 59 | Admitting: *Deleted

## 2023-07-29 DIAGNOSIS — J309 Allergic rhinitis, unspecified: Secondary | ICD-10-CM

## 2023-08-05 ENCOUNTER — Ambulatory Visit (INDEPENDENT_AMBULATORY_CARE_PROVIDER_SITE_OTHER): Payer: 59

## 2023-08-05 DIAGNOSIS — J309 Allergic rhinitis, unspecified: Secondary | ICD-10-CM

## 2023-08-12 ENCOUNTER — Ambulatory Visit (INDEPENDENT_AMBULATORY_CARE_PROVIDER_SITE_OTHER): Payer: Self-pay | Admitting: *Deleted

## 2023-08-12 DIAGNOSIS — J309 Allergic rhinitis, unspecified: Secondary | ICD-10-CM

## 2023-08-19 ENCOUNTER — Ambulatory Visit (INDEPENDENT_AMBULATORY_CARE_PROVIDER_SITE_OTHER): Payer: 59

## 2023-08-19 DIAGNOSIS — J309 Allergic rhinitis, unspecified: Secondary | ICD-10-CM | POA: Diagnosis not present

## 2023-08-25 ENCOUNTER — Ambulatory Visit (INDEPENDENT_AMBULATORY_CARE_PROVIDER_SITE_OTHER): Payer: 59 | Admitting: *Deleted

## 2023-08-25 DIAGNOSIS — J309 Allergic rhinitis, unspecified: Secondary | ICD-10-CM

## 2023-09-01 ENCOUNTER — Ambulatory Visit (INDEPENDENT_AMBULATORY_CARE_PROVIDER_SITE_OTHER): Payer: 59 | Admitting: *Deleted

## 2023-09-01 DIAGNOSIS — J309 Allergic rhinitis, unspecified: Secondary | ICD-10-CM

## 2023-09-08 ENCOUNTER — Ambulatory Visit (INDEPENDENT_AMBULATORY_CARE_PROVIDER_SITE_OTHER): Payer: 59

## 2023-09-08 DIAGNOSIS — J309 Allergic rhinitis, unspecified: Secondary | ICD-10-CM

## 2023-09-15 ENCOUNTER — Ambulatory Visit (INDEPENDENT_AMBULATORY_CARE_PROVIDER_SITE_OTHER): Payer: 59

## 2023-09-15 DIAGNOSIS — J309 Allergic rhinitis, unspecified: Secondary | ICD-10-CM

## 2023-09-26 ENCOUNTER — Ambulatory Visit (INDEPENDENT_AMBULATORY_CARE_PROVIDER_SITE_OTHER): Payer: 59

## 2023-09-26 DIAGNOSIS — J309 Allergic rhinitis, unspecified: Secondary | ICD-10-CM | POA: Diagnosis not present

## 2023-09-30 ENCOUNTER — Ambulatory Visit (INDEPENDENT_AMBULATORY_CARE_PROVIDER_SITE_OTHER): Payer: 59 | Admitting: *Deleted

## 2023-09-30 DIAGNOSIS — J309 Allergic rhinitis, unspecified: Secondary | ICD-10-CM

## 2023-10-02 DIAGNOSIS — Z0000 Encounter for general adult medical examination without abnormal findings: Secondary | ICD-10-CM | POA: Diagnosis not present

## 2023-10-02 DIAGNOSIS — Z13220 Encounter for screening for lipoid disorders: Secondary | ICD-10-CM | POA: Diagnosis not present

## 2023-10-10 ENCOUNTER — Ambulatory Visit (INDEPENDENT_AMBULATORY_CARE_PROVIDER_SITE_OTHER): Payer: Self-pay

## 2023-10-10 DIAGNOSIS — J309 Allergic rhinitis, unspecified: Secondary | ICD-10-CM | POA: Diagnosis not present

## 2023-10-17 ENCOUNTER — Ambulatory Visit (INDEPENDENT_AMBULATORY_CARE_PROVIDER_SITE_OTHER): Payer: Self-pay

## 2023-10-17 DIAGNOSIS — J309 Allergic rhinitis, unspecified: Secondary | ICD-10-CM | POA: Diagnosis not present

## 2023-10-28 ENCOUNTER — Ambulatory Visit (INDEPENDENT_AMBULATORY_CARE_PROVIDER_SITE_OTHER): Payer: Self-pay

## 2023-10-28 DIAGNOSIS — J309 Allergic rhinitis, unspecified: Secondary | ICD-10-CM | POA: Diagnosis not present

## 2023-11-07 ENCOUNTER — Ambulatory Visit (INDEPENDENT_AMBULATORY_CARE_PROVIDER_SITE_OTHER): Payer: 59

## 2023-11-07 DIAGNOSIS — J309 Allergic rhinitis, unspecified: Secondary | ICD-10-CM

## 2023-11-11 ENCOUNTER — Ambulatory Visit (INDEPENDENT_AMBULATORY_CARE_PROVIDER_SITE_OTHER): Payer: Self-pay | Admitting: *Deleted

## 2023-11-11 DIAGNOSIS — J309 Allergic rhinitis, unspecified: Secondary | ICD-10-CM | POA: Diagnosis not present

## 2023-11-28 ENCOUNTER — Ambulatory Visit (INDEPENDENT_AMBULATORY_CARE_PROVIDER_SITE_OTHER): Payer: Self-pay

## 2023-11-28 DIAGNOSIS — J309 Allergic rhinitis, unspecified: Secondary | ICD-10-CM

## 2023-12-05 ENCOUNTER — Ambulatory Visit (INDEPENDENT_AMBULATORY_CARE_PROVIDER_SITE_OTHER): Payer: Self-pay | Admitting: *Deleted

## 2023-12-05 DIAGNOSIS — J309 Allergic rhinitis, unspecified: Secondary | ICD-10-CM

## 2023-12-11 ENCOUNTER — Ambulatory Visit (INDEPENDENT_AMBULATORY_CARE_PROVIDER_SITE_OTHER): Payer: Self-pay

## 2023-12-11 DIAGNOSIS — J309 Allergic rhinitis, unspecified: Secondary | ICD-10-CM | POA: Diagnosis not present

## 2023-12-19 ENCOUNTER — Ambulatory Visit (INDEPENDENT_AMBULATORY_CARE_PROVIDER_SITE_OTHER): Payer: Self-pay

## 2023-12-19 DIAGNOSIS — J309 Allergic rhinitis, unspecified: Secondary | ICD-10-CM

## 2023-12-26 ENCOUNTER — Ambulatory Visit (INDEPENDENT_AMBULATORY_CARE_PROVIDER_SITE_OTHER)

## 2023-12-26 DIAGNOSIS — J309 Allergic rhinitis, unspecified: Secondary | ICD-10-CM | POA: Diagnosis not present

## 2023-12-31 ENCOUNTER — Ambulatory Visit (INDEPENDENT_AMBULATORY_CARE_PROVIDER_SITE_OTHER): Payer: Self-pay

## 2023-12-31 DIAGNOSIS — J309 Allergic rhinitis, unspecified: Secondary | ICD-10-CM

## 2024-01-08 ENCOUNTER — Ambulatory Visit (INDEPENDENT_AMBULATORY_CARE_PROVIDER_SITE_OTHER): Payer: Self-pay

## 2024-01-08 DIAGNOSIS — J309 Allergic rhinitis, unspecified: Secondary | ICD-10-CM | POA: Diagnosis not present

## 2024-01-15 ENCOUNTER — Ambulatory Visit (INDEPENDENT_AMBULATORY_CARE_PROVIDER_SITE_OTHER): Payer: Self-pay

## 2024-01-15 DIAGNOSIS — J309 Allergic rhinitis, unspecified: Secondary | ICD-10-CM

## 2024-01-23 ENCOUNTER — Ambulatory Visit (INDEPENDENT_AMBULATORY_CARE_PROVIDER_SITE_OTHER): Payer: Self-pay

## 2024-01-23 DIAGNOSIS — J309 Allergic rhinitis, unspecified: Secondary | ICD-10-CM

## 2024-01-29 ENCOUNTER — Ambulatory Visit (INDEPENDENT_AMBULATORY_CARE_PROVIDER_SITE_OTHER): Payer: Self-pay

## 2024-01-29 DIAGNOSIS — J309 Allergic rhinitis, unspecified: Secondary | ICD-10-CM

## 2024-02-05 ENCOUNTER — Ambulatory Visit (INDEPENDENT_AMBULATORY_CARE_PROVIDER_SITE_OTHER)

## 2024-02-05 DIAGNOSIS — J309 Allergic rhinitis, unspecified: Secondary | ICD-10-CM

## 2024-02-13 ENCOUNTER — Ambulatory Visit (INDEPENDENT_AMBULATORY_CARE_PROVIDER_SITE_OTHER)

## 2024-02-13 DIAGNOSIS — J309 Allergic rhinitis, unspecified: Secondary | ICD-10-CM | POA: Diagnosis not present

## 2024-02-20 ENCOUNTER — Ambulatory Visit (INDEPENDENT_AMBULATORY_CARE_PROVIDER_SITE_OTHER): Payer: Self-pay

## 2024-02-20 DIAGNOSIS — J309 Allergic rhinitis, unspecified: Secondary | ICD-10-CM | POA: Diagnosis not present

## 2024-02-23 DIAGNOSIS — J3081 Allergic rhinitis due to animal (cat) (dog) hair and dander: Secondary | ICD-10-CM | POA: Diagnosis not present

## 2024-02-23 NOTE — Progress Notes (Signed)
 VIAL MADE 02-23-24

## 2024-02-24 DIAGNOSIS — J302 Other seasonal allergic rhinitis: Secondary | ICD-10-CM | POA: Diagnosis not present

## 2024-03-04 ENCOUNTER — Ambulatory Visit (INDEPENDENT_AMBULATORY_CARE_PROVIDER_SITE_OTHER): Payer: Self-pay

## 2024-03-04 DIAGNOSIS — J309 Allergic rhinitis, unspecified: Secondary | ICD-10-CM

## 2024-03-18 ENCOUNTER — Ambulatory Visit

## 2024-03-18 DIAGNOSIS — J309 Allergic rhinitis, unspecified: Secondary | ICD-10-CM | POA: Diagnosis not present

## 2024-04-02 ENCOUNTER — Ambulatory Visit (INDEPENDENT_AMBULATORY_CARE_PROVIDER_SITE_OTHER)

## 2024-04-02 DIAGNOSIS — J309 Allergic rhinitis, unspecified: Secondary | ICD-10-CM

## 2024-04-14 ENCOUNTER — Ambulatory Visit (INDEPENDENT_AMBULATORY_CARE_PROVIDER_SITE_OTHER)

## 2024-04-14 DIAGNOSIS — J309 Allergic rhinitis, unspecified: Secondary | ICD-10-CM | POA: Diagnosis not present

## 2024-04-23 ENCOUNTER — Ambulatory Visit

## 2024-04-23 DIAGNOSIS — J309 Allergic rhinitis, unspecified: Secondary | ICD-10-CM

## 2024-04-29 ENCOUNTER — Ambulatory Visit (INDEPENDENT_AMBULATORY_CARE_PROVIDER_SITE_OTHER)

## 2024-04-29 DIAGNOSIS — J309 Allergic rhinitis, unspecified: Secondary | ICD-10-CM | POA: Diagnosis not present

## 2024-05-27 ENCOUNTER — Ambulatory Visit (INDEPENDENT_AMBULATORY_CARE_PROVIDER_SITE_OTHER)

## 2024-05-27 DIAGNOSIS — J309 Allergic rhinitis, unspecified: Secondary | ICD-10-CM | POA: Diagnosis not present

## 2024-06-25 ENCOUNTER — Ambulatory Visit (INDEPENDENT_AMBULATORY_CARE_PROVIDER_SITE_OTHER)

## 2024-06-25 DIAGNOSIS — J309 Allergic rhinitis, unspecified: Secondary | ICD-10-CM

## 2024-07-19 ENCOUNTER — Ambulatory Visit (INDEPENDENT_AMBULATORY_CARE_PROVIDER_SITE_OTHER)

## 2024-07-19 DIAGNOSIS — J309 Allergic rhinitis, unspecified: Secondary | ICD-10-CM

## 2024-08-17 ENCOUNTER — Ambulatory Visit (INDEPENDENT_AMBULATORY_CARE_PROVIDER_SITE_OTHER)

## 2024-08-17 DIAGNOSIS — J309 Allergic rhinitis, unspecified: Secondary | ICD-10-CM | POA: Diagnosis not present

## 2024-09-14 ENCOUNTER — Ambulatory Visit

## 2024-09-14 DIAGNOSIS — J309 Allergic rhinitis, unspecified: Secondary | ICD-10-CM | POA: Diagnosis not present

## 2024-10-14 ENCOUNTER — Ambulatory Visit

## 2024-10-14 DIAGNOSIS — J302 Other seasonal allergic rhinitis: Secondary | ICD-10-CM
